# Patient Record
Sex: Female | Born: 1965 | Race: White | Hispanic: No | Marital: Single | Smoking: Current every day smoker
Health system: Southern US, Community
[De-identification: ages and names within clinical notes are randomized; demographics above are authoritative.]

## PROBLEM LIST (undated history)

## (undated) DIAGNOSIS — G35 Multiple sclerosis: Secondary | ICD-10-CM

## (undated) HISTORY — PX: BREAST SURGERY: SHX581

## (undated) HISTORY — PX: CHOLECYSTECTOMY: SHX55

## (undated) HISTORY — PX: ABDOMINAL HYSTERECTOMY: SHX81

---

## 2017-04-23 ENCOUNTER — Other Ambulatory Visit: Payer: Self-pay

## 2017-04-23 ENCOUNTER — Emergency Department
Admission: EM | Admit: 2017-04-23 | Discharge: 2017-04-23 | Disposition: A | Discharge status: Home / Self Care | Payer: Self-pay | Source: Home / Self Care | Attending: Family Medicine | Admitting: Family Medicine

## 2017-04-23 ENCOUNTER — Emergency Department (INDEPENDENT_AMBULATORY_CARE_PROVIDER_SITE_OTHER): Payer: Self-pay

## 2017-04-23 DIAGNOSIS — M79641 Pain in right hand: Secondary | ICD-10-CM

## 2017-04-23 DIAGNOSIS — S63501A Unspecified sprain of right wrist, initial encounter: Secondary | ICD-10-CM

## 2017-04-23 DIAGNOSIS — M1811 Unilateral primary osteoarthritis of first carpometacarpal joint, right hand: Secondary | ICD-10-CM

## 2017-04-23 DIAGNOSIS — M778 Other enthesopathies, not elsewhere classified: Secondary | ICD-10-CM

## 2017-04-23 DIAGNOSIS — Z23 Encounter for immunization: Secondary | ICD-10-CM

## 2017-04-23 DIAGNOSIS — M19041 Primary osteoarthritis, right hand: Secondary | ICD-10-CM

## 2017-04-23 HISTORY — DX: Multiple sclerosis: G35

## 2017-04-23 MED ORDER — INFLUENZA VAC SPLIT QUAD 0.5 ML IM SUSY
0.5000 mL | PREFILLED_SYRINGE | INTRAMUSCULAR | Status: AC
  Administered 2017-04-23: 0.5 mL via INTRAMUSCULAR

## 2017-04-23 NOTE — Discharge Instructions (Signed)
  You may take 500mg acetaminophen every 4-6 hours or in combination with ibuprofen 400-600mg every 6-8 hours as needed for pain and inflammation.  

## 2017-04-23 NOTE — ED Provider Notes (Signed)
[facility]    CSN: [id number] Arrival date & time: 04/23/17  1749     History   Chief Complaint Chief Complaint  Patient presents with  . Hand Pain    HPI Diana RogueMarsha Perz is a 51 y.o. female.   HPI Diana Williams is a 51 y.o. 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  Pain is aching and sore, worse with certain movements, worse over the last 3-4 days.  Pain is throbbing, 6/10 at this time.  She has taken ibuprofen, aleve and tylenol w/o relief. [place]" [place]  She is Right hand dominant.    Past Medical History:  Diagnosis Date  . Multiple sclerosis (HCC)     There are no active problems to display for this patient.   Past Surgical History:  Procedure Laterality Date  . ABDOMINAL HYSTERECTOMY    . BREAST SURGERY    . CHOLECYSTECTOMY      OB History    No data available       Home Medications    Prior to Admission medications   Medication Sig Start Date End Date Taking? Authorizing Provider  amphetamine-dextroamphetamine (ADDERALL) 20 MG tablet Take 20 mg by mouth daily.   Yes [provider]    Family History History reviewed. No pertinent family history.  Social History Social History   Tobacco Use  . Smoking status: Current Every Day Smoker    Types: E-cigarettes  Substance Use Topics  . Alcohol use: No    Frequency: Never  . Drug use: No     Allergies   Patient has no known allergies.   Review of Systems Review of Systems  Musculoskeletal: Positive for arthralgias, joint swelling and myalgias.  Skin: Negative for color change and wound.  Neurological: Positive for weakness. Negative for numbness.     Physical Exam Triage Vital Signs ED Triage Vitals  Enc Vitals Group     BP 04/23/17 1807 125/81     Pulse Rate 04/23/17 1807 81     Resp  --      Temp 04/23/17 1807 98 F (36.7 C)     Temp Source 04/23/17 1807 Oral     SpO2 04/23/17 1807 98 %     Weight 04/23/17 1808 209 lb (94.8 kg)     Height 04/23/17 1808 5\' 6"  (1.676 m)     Head Circumference --      Peak Flow --      Pain Score 04/23/17 1808 6     Pain Loc --      Pain Edu? --      Excl. in GC? --    No data found.  Updated Vital Signs BP 125/81 (BP Location: Left Arm)   Pulse 81   Temp 98 F (36.7 C) (Oral)   Ht 5\' 6"  (1.676 m)   Wt 209 lb (94.8 kg)   SpO2 98%   BMI 33.73 kg/m   Visual Acuity Right Eye Distance:   Left Eye Distance:   Bilateral Distance:    Right Eye Near:   Left Eye Near:    Bilateral Near:     Physical Exam  Constitutional: She is oriented to person, place, and time. She appears well-developed and well-nourished. No distress.  HENT:  Head: Normocephalic and atraumatic.  Eyes: EOM are normal.  Neck: Normal range of motion.  Cardiovascular: Normal rate.  Pulses:      Radial pulses are 2+ on the right side.  Pulmonary/Chest: Effort normal.  Musculoskeletal: Normal range of motion. She exhibits tenderness. She exhibits no edema or deformity.  Right hand and wrist: no obvious edema or deformity. Tenderness along radial aspect. Full ROM. 4/5 grip strength compared to Left hand. Right elbow: non-tender, full ROM  Neurological: She is alert and oriented to person, place, and time.  Skin: Skin is warm and dry. Capillary refill takes less than 2 seconds. She is not diaphoretic.  Right hand and wrist: skin in tact. No ecchymosis or erythema.   Psychiatric: She has a normal mood and affect. Her behavior is normal.  Nursing note and vitals reviewed.    UC Treatments / Results  Labs (all labs ordered are listed, but only abnormal results are displayed) Labs Reviewed - No data to display  EKG  EKG Interpretation None       Radiology Dg Wrist Complete Right  Result Date: 04/23/2017 CLINICAL DATA:  Right radial wrist and  hand pain after fall 1 week prior. Fall off scaffolding catching self on a counter with right hand. EXAM: RIGHT WRIST - COMPLETE 3+ VIEW COMPARISON:  None. FINDINGS: There is no evidence of fracture or dislocation. Minimal radial styloid spurring with small ill-defined density be sequela of remote prior injury or accessory ossicle. The scaphoid is intact. Alignment is maintained. Soft tissues are unremarkable. IMPRESSION: 1. No acute fracture or subluxation. 2. Minimal radius styloid spurring consistent with degenerative change. Electronically Signed   By: Rubye OaksMelanie  Ehinger M.D.   On: 04/23/2017 18:42   Dg Hand Complete Right  Result Date: 04/23/2017 CLINICAL DATA:  Right radial wrist and hand pain after fall 1 week prior. Fall off scaffolding catching self on a counter with right hand. EXAM: RIGHT HAND - COMPLETE 3+ VIEW COMPARISON:  None. FINDINGS: There is no evidence of fracture or dislocation. Patient's rings obscure evaluation of the proximal third and fourth digits. Mild spurring of the first carpal metacarpal joint. Trace osteoarthritis of the digits. Soft tissues are unremarkable. IMPRESSION: 1. No fracture or dislocation. 2. Minimal osteoarthritis of the digits and first carpal metacarpal joint. Electronically Signed   By: Rubye OaksMelanie  Ehinger M.D.   On: 04/23/2017 18:43    Procedures Procedures (including critical care time)  Medications Ordered in UC Medications  Influenza vac split quadrivalent PF (FLUARIX) injection 0.5 mL (0.5 mLs Intramuscular Given 04/23/17 1817)     Initial Impression / Assessment and Plan / UC Course  I have reviewed the triage vital signs and the nursing notes.  Pertinent labs & imaging results that were available during my care of the patient were reviewed by me and considered in my medical decision making (see chart for details).     Hx and exam c/w Right wrist sprain with OA in first metacarpal joint w/o fracture or dislocation Offered thumb spica splint,  pt will purchase one OTC due to lack of insurance F/u with Sports Medicine in 1-2 weeks as needed.   Final Clinical Impressions(s) / UC Diagnoses   Final diagnoses:  Right hand pain  Right wrist sprain, initial encounter  Osteoarthritis of right hand, unspecified osteoarthritis type    ED Discharge Orders    None       Controlled Substance Prescriptions [place] Controlled Substance Registry consulted? Not Applicable   Rolla Platehelps, Fabrizzio Marcella O, PA-C 04/23/17 1920

## 2017-04-23 NOTE — ED Triage Notes (Signed)
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  Was swollen and tender for 3-4 days, the last 3 days the pain has been throbbing.Marland Kitchen.  Has tried ibuprofen, aleve, and tylenol.

## 2019-05-09 IMAGING — DX DG HAND COMPLETE 3+V*R*
3 series · 3 of 3 positions shown · non-contrast
Comparison: None.

CLINICAL DATA: Right radial wrist and hand pain after fall 1 week
prior. Fall off scaffolding catching self on a counter with right
hand.

EXAM:
RIGHT HAND - COMPLETE 3+ VIEW

[hand pa]
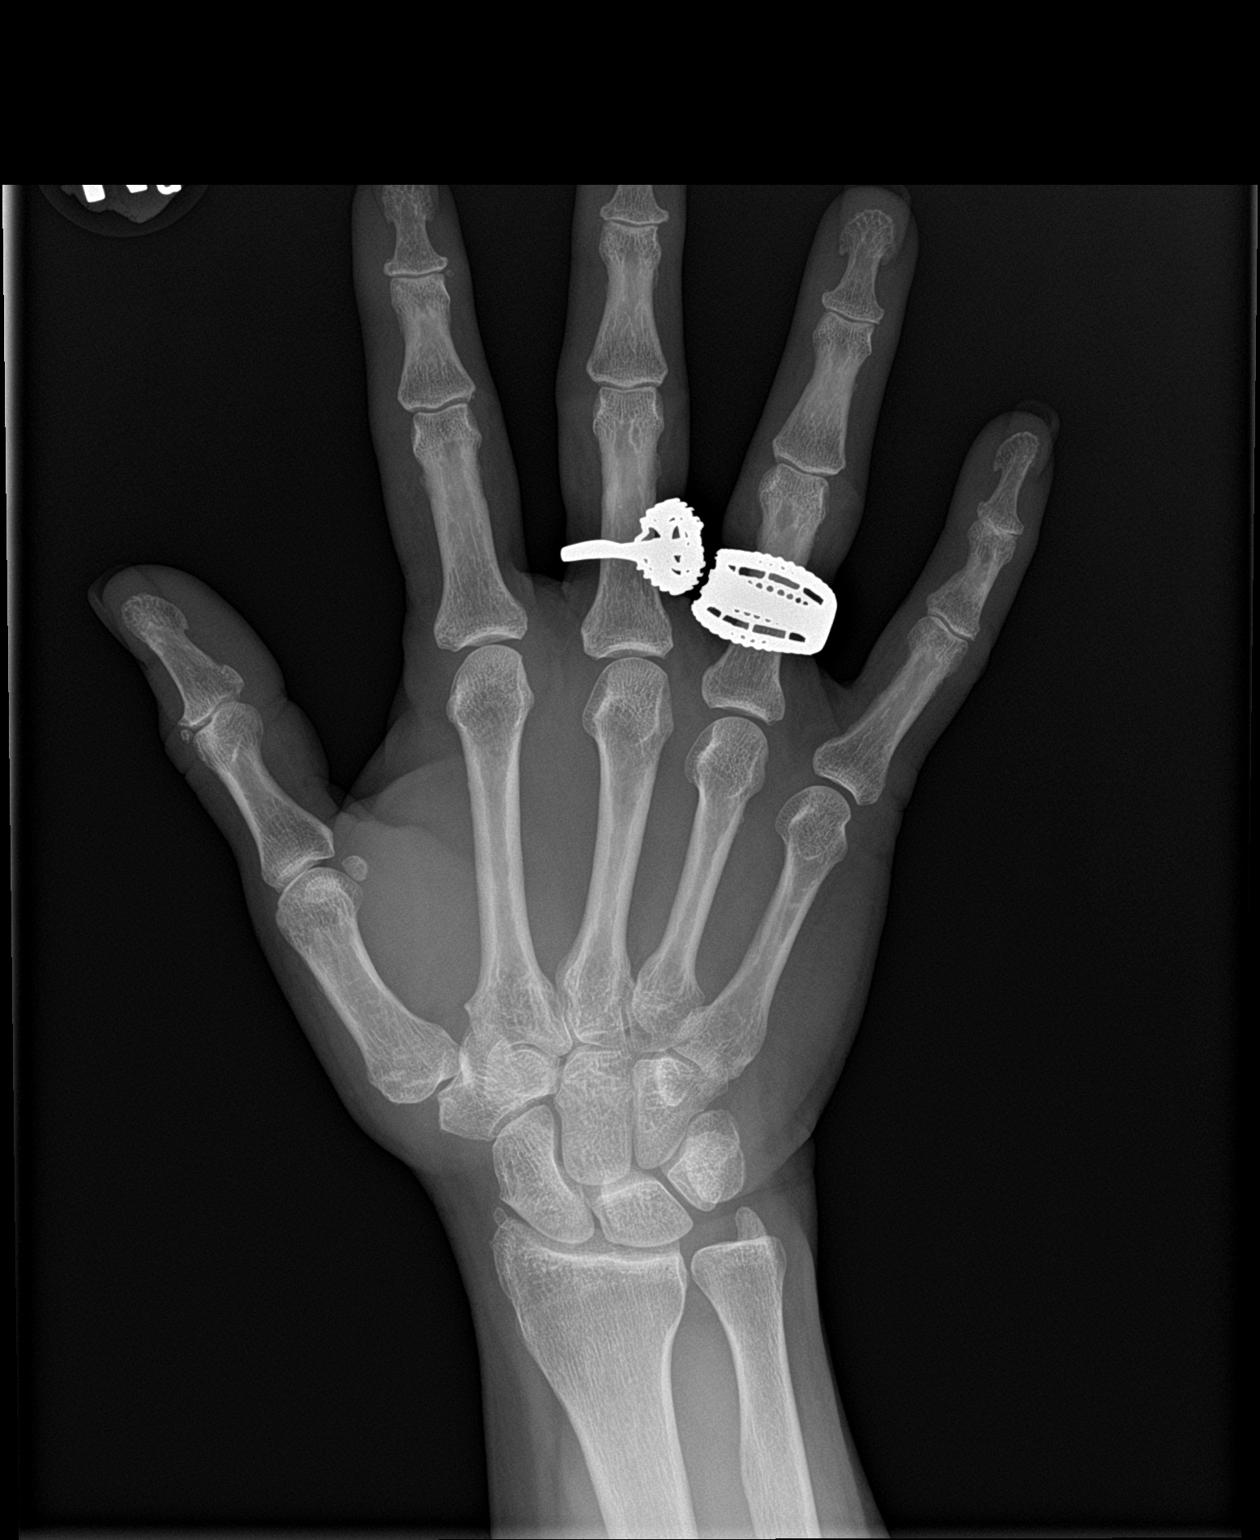

[hand obl]
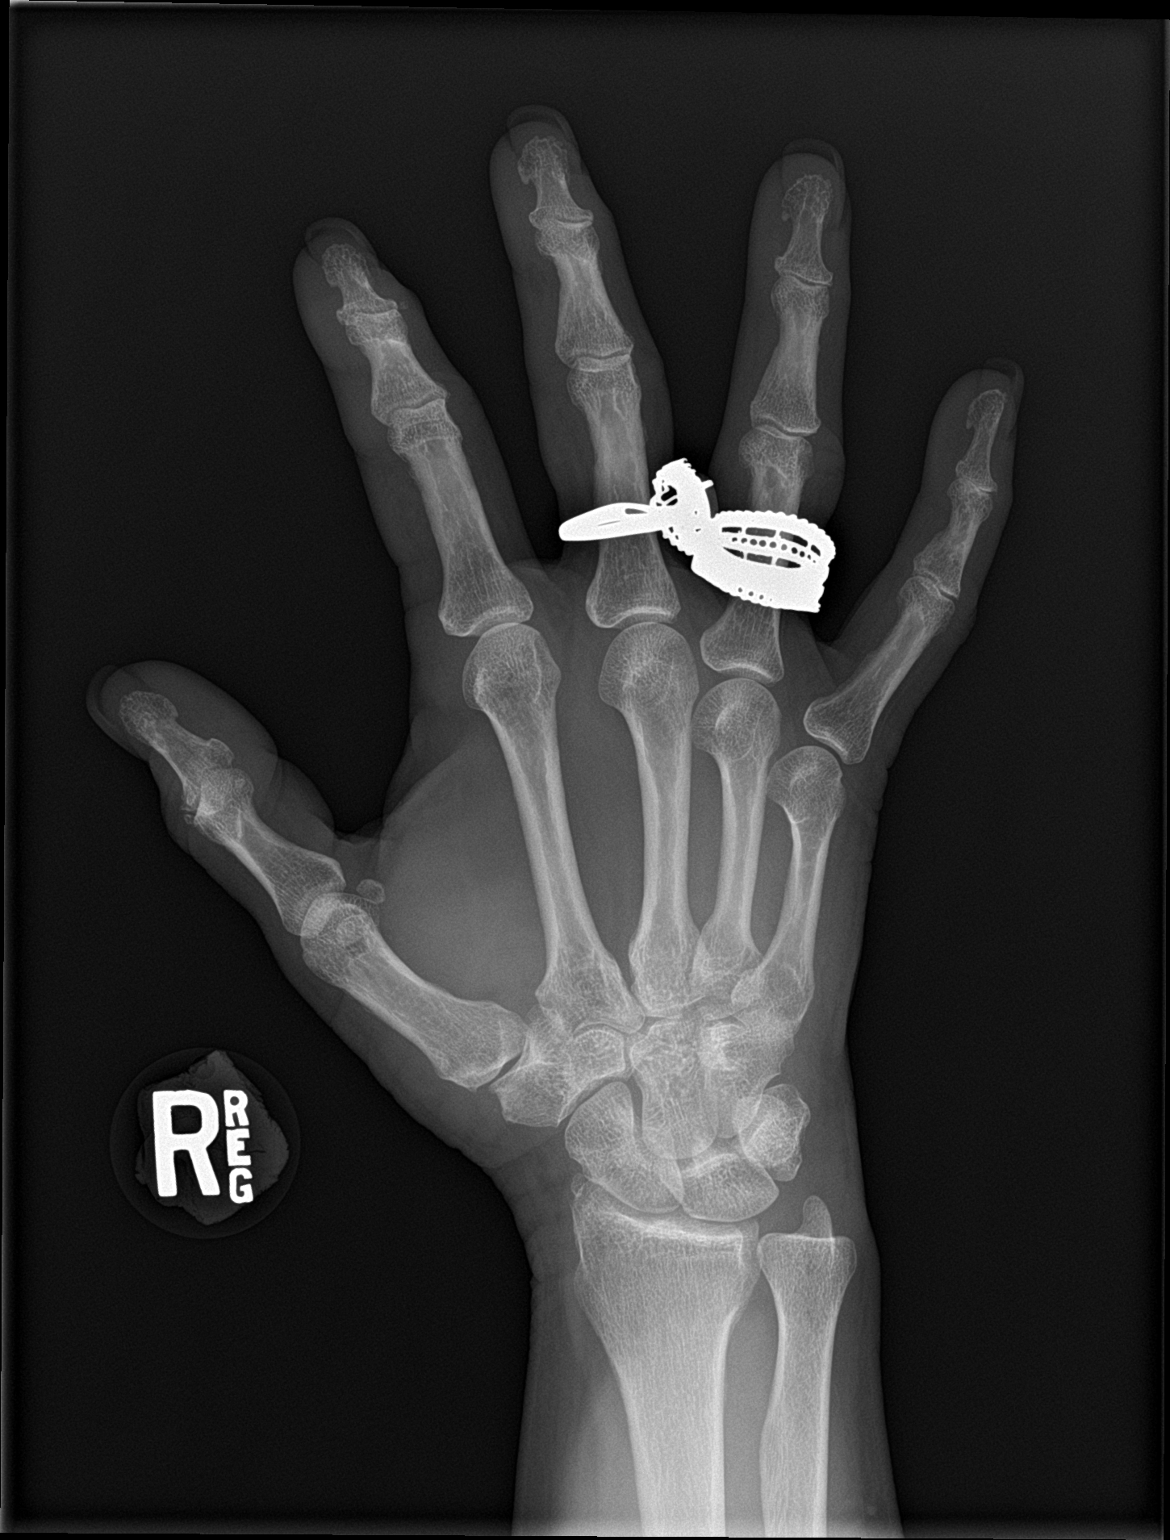

[hand lat]
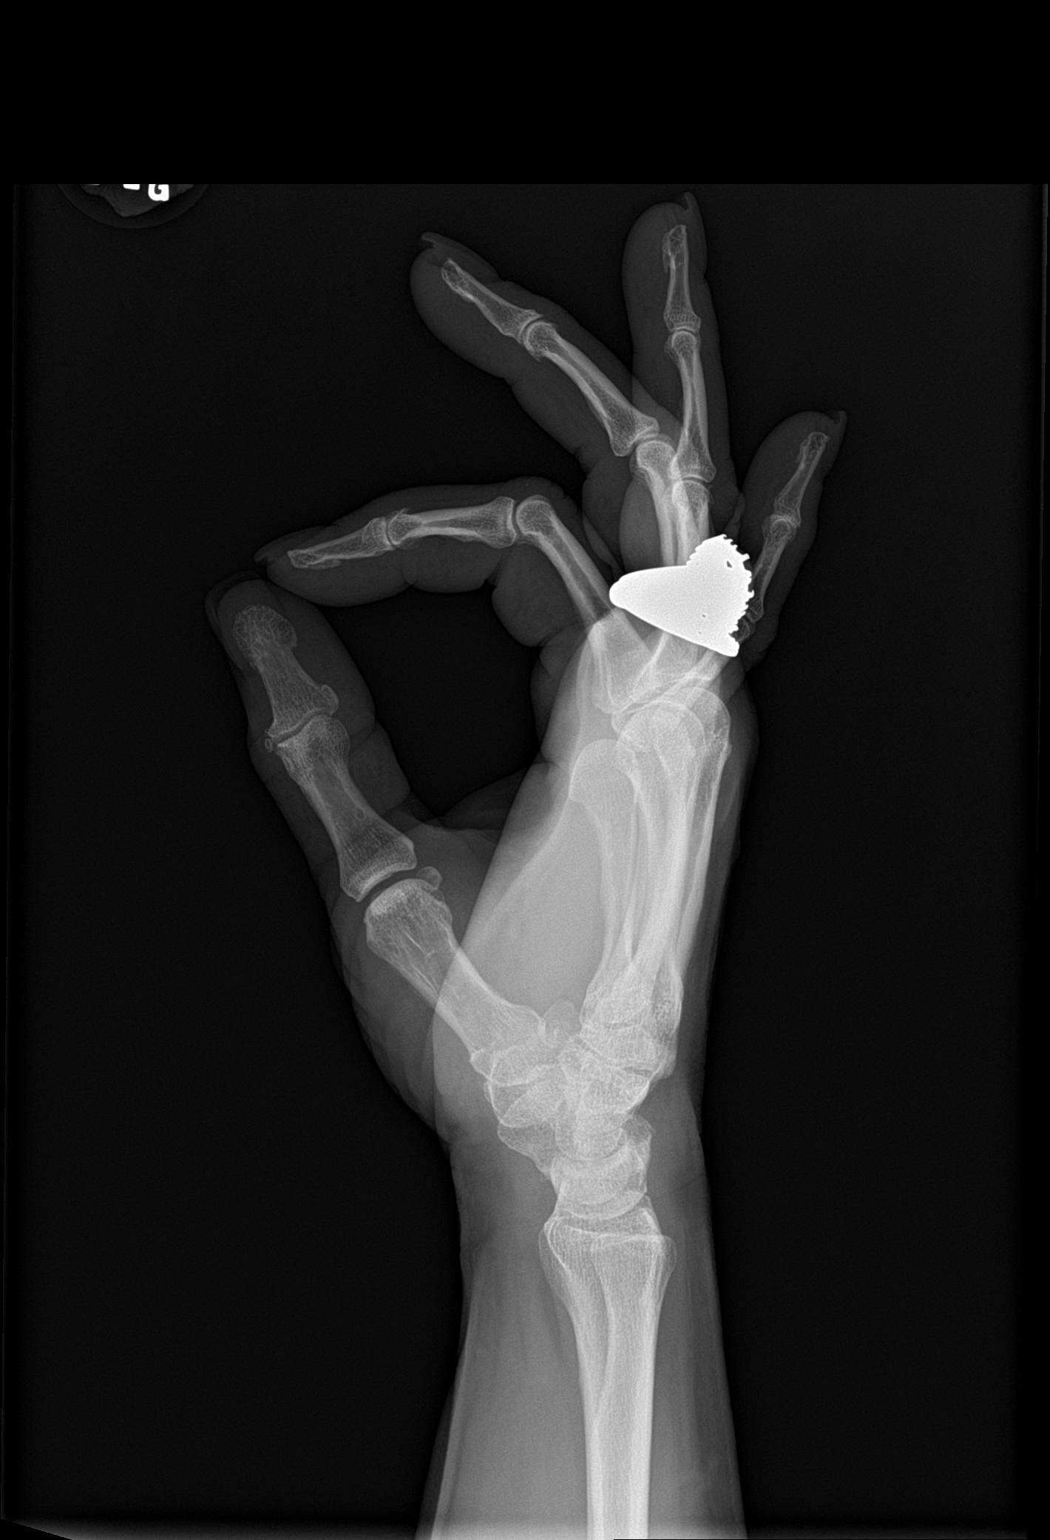

[3 of 3 positions shown; findings below may reference images not displayed]

FINDINGS: There is no evidence of fracture or dislocation. Patient's rings
obscure evaluation of the proximal third and fourth digits. Mild
spurring of the first carpal metacarpal joint. Trace osteoarthritis
of the digits. Soft tissues are unremarkable.
IMPRESSION: 1. No fracture or dislocation.
2. Minimal osteoarthritis of the digits and first carpal metacarpal
joint.
